# Patient Record
Sex: Male | Born: 1990 | Race: White | Hispanic: No | Marital: Single
Health system: Southern US, Community
[De-identification: ages and names within clinical notes are randomized; demographics above are authoritative.]

---

## 2014-10-02 DIAGNOSIS — R109 Unspecified abdominal pain: Secondary | ICD-10-CM | POA: Diagnosis present

## 2014-10-02 DIAGNOSIS — R51 Headache: Secondary | ICD-10-CM | POA: Insufficient documentation

## 2014-10-02 DIAGNOSIS — K529 Noninfective gastroenteritis and colitis, unspecified: Secondary | ICD-10-CM | POA: Insufficient documentation

## 2014-10-02 DIAGNOSIS — E876 Hypokalemia: Secondary | ICD-10-CM | POA: Diagnosis not present

## 2014-10-03 ENCOUNTER — Emergency Department: Payer: 59

## 2014-10-03 ENCOUNTER — Emergency Department
Admission: EM | Admit: 2014-10-03 | Discharge: 2014-10-03 | Disposition: A | Discharge status: Home / Self Care | Payer: 59 | Attending: Emergency Medicine | Admitting: Emergency Medicine

## 2014-10-03 DIAGNOSIS — R1084 Generalized abdominal pain: Secondary | ICD-10-CM

## 2014-10-03 DIAGNOSIS — R197 Diarrhea, unspecified: Secondary | ICD-10-CM

## 2014-10-03 DIAGNOSIS — R112 Nausea with vomiting, unspecified: Secondary | ICD-10-CM

## 2014-10-03 DIAGNOSIS — K529 Noninfective gastroenteritis and colitis, unspecified: Secondary | ICD-10-CM

## 2014-10-03 DIAGNOSIS — E876 Hypokalemia: Secondary | ICD-10-CM

## 2014-10-03 LAB — CBC WITH DIFFERENTIAL/PLATELET
Basophils Absolute: 0 10*3/uL (ref 0–0.1)
Basophils Relative: 0 %
EOS ABS: 0.1 10*3/uL (ref 0–0.7)
Eosinophils Relative: 1 %
HCT: 43 % (ref 40.0–52.0)
HEMOGLOBIN: 14.3 g/dL (ref 13.0–18.0)
Lymphocytes Relative: 12 %
Lymphs Abs: 1.4 10*3/uL (ref 1.0–3.6)
MCH: 29.8 pg (ref 26.0–34.0)
MCHC: 33.4 g/dL (ref 32.0–36.0)
MCV: 89.3 fL (ref 80.0–100.0)
MONO ABS: 0.9 10*3/uL (ref 0.2–1.0)
Monocytes Relative: 7 %
Neutro Abs: 9.5 10*3/uL — ABNORMAL HIGH (ref 1.4–6.5)
Neutrophils Relative %: 80 %
Platelets: 172 10*3/uL (ref 150–440)
RBC: 4.81 MIL/uL (ref 4.40–5.90)
RDW: 13.3 % (ref 11.5–14.5)
WBC: 11.9 10*3/uL — AB (ref 3.8–10.6)

## 2014-10-03 LAB — COMPREHENSIVE METABOLIC PANEL
ALBUMIN: 4.3 g/dL (ref 3.5–5.0)
ALT: 20 U/L (ref 17–63)
AST: 26 U/L (ref 15–41)
Alkaline Phosphatase: 49 U/L (ref 38–126)
Anion gap: 11 (ref 5–15)
BUN: 8 mg/dL (ref 6–20)
CO2: 23 mmol/L (ref 22–32)
Calcium: 9.5 mg/dL (ref 8.9–10.3)
Chloride: 102 mmol/L (ref 101–111)
Creatinine, Ser: 1.11 mg/dL (ref 0.61–1.24)
GFR calc non Af Amer: >60 mL/min (ref >60)
Glucose, Bld: 134 mg/dL — ABNORMAL HIGH (ref 65–99)
Potassium: 3.1 mmol/L — ABNORMAL LOW (ref 3.5–5.1)
Sodium: 136 mmol/L (ref 135–145)
Total Bilirubin: 0.8 mg/dL (ref 0.3–1.2)
Total Protein: 7.5 g/dL (ref 6.5–8.1)

## 2014-10-03 LAB — LIPASE, BLOOD: LIPASE: 46 U/L (ref 22–51)

## 2014-10-03 LAB — C DIFFICILE QUICK SCREEN W PCR REFLEX
C DIFFICILE (CDIFF) INTERP: NEGATIVE
C DIFFICILE (CDIFF) TOXIN: NEGATIVE
C Diff antigen: NEGATIVE

## 2014-10-03 MED ORDER — MORPHINE SULFATE 2 MG/ML IJ SOLN
INTRAMUSCULAR | Status: AC
  Administered 2014-10-03: 2 mg via INTRAVENOUS
  Filled 2014-10-03: qty 1

## 2014-10-03 MED ORDER — DICYCLOMINE HCL 20 MG PO TABS: 20.0000 mg | ORAL_TABLET | Freq: Four times a day (QID) | ORAL | Status: AC | PRN

## 2014-10-03 MED ORDER — MORPHINE SULFATE 2 MG/ML IJ SOLN
INTRAMUSCULAR | Status: AC
  Filled 2014-10-03: qty 1

## 2014-10-03 MED ORDER — ONDANSETRON HCL 4 MG/2ML IJ SOLN
4.0000 mg | Freq: Once | INTRAMUSCULAR | Status: AC
  Administered 2014-10-03: 4 mg via INTRAVENOUS

## 2014-10-03 MED ORDER — POTASSIUM CHLORIDE CRYS ER 20 MEQ PO TBCR
EXTENDED_RELEASE_TABLET | ORAL | Status: AC
  Administered 2014-10-03: 40 meq via ORAL
  Filled 2014-10-03: qty 2

## 2014-10-03 MED ORDER — IOHEXOL 240 MG/ML SOLN
25.0000 mL | Freq: Once | INTRAMUSCULAR | Status: AC | PRN
  Administered 2014-10-03: 25 mL via ORAL

## 2014-10-03 MED ORDER — MORPHINE SULFATE 2 MG/ML IJ SOLN
2.0000 mg | Freq: Once | INTRAMUSCULAR | Status: AC
  Administered 2014-10-03: 2 mg via INTRAVENOUS

## 2014-10-03 MED ORDER — SODIUM CHLORIDE 0.9 % IV BOLUS (SEPSIS)
1000.0000 mL | Freq: Once | INTRAVENOUS | Status: AC
  Administered 2014-10-03: 1000 mL via INTRAVENOUS

## 2014-10-03 MED ORDER — POTASSIUM CHLORIDE CRYS ER 20 MEQ PO TBCR
40.0000 meq | EXTENDED_RELEASE_TABLET | Freq: Once | ORAL | Status: AC
  Administered 2014-10-03: 40 meq via ORAL

## 2014-10-03 MED ORDER — ONDANSETRON HCL 4 MG PO TABS: 4.0000 mg | ORAL_TABLET | Freq: Three times a day (TID) | ORAL | Status: AC | PRN

## 2014-10-03 MED ORDER — ONDANSETRON HCL 4 MG/2ML IJ SOLN
INTRAMUSCULAR | Status: AC
  Administered 2014-10-03: 4 mg via INTRAVENOUS
  Filled 2014-10-03: qty 2

## 2014-10-03 MED ORDER — ONDANSETRON HCL 4 MG/2ML IJ SOLN
INTRAMUSCULAR | Status: AC
  Filled 2014-10-03: qty 2

## 2014-10-03 MED ORDER — IOHEXOL 300 MG/ML  SOLN
100.0000 mL | Freq: Once | INTRAMUSCULAR | Status: AC | PRN
  Administered 2014-10-03: 100 mL via INTRAVENOUS

## 2014-10-03 NOTE — ED Notes (Signed)
Pt. Leaving with parents.

## 2014-10-03 NOTE — ED Notes (Signed)
Pt to CT at this time.

## 2014-10-03 NOTE — ED Notes (Addendum)
Hat placed in toilet at this time, pt made aware and verbalized understanding for need for sample. Will let RN know once uses restroom.

## 2014-10-03 NOTE — ED Notes (Signed)
Pt in with co abd pain, n.v.d since yest and fever.

## 2014-10-03 NOTE — Discharge Instructions (Signed)
1. Take medicines as needed for nausea and abdominal cramping (Zofran/Bentyl #20). 2. Clear liquids 12 hours, then Brat diet 2 days. Slowly advance diet as tolerated. 3. Return to the ER for worsening symptoms, persistent vomiting, difficulty breathing or other concerns.  Nausea and Vomiting Nausea is a sick feeling that often comes before throwing up (vomiting). Vomiting is a reflex where stomach contents come out of your mouth. Vomiting can cause severe loss of body fluids (dehydration). Children and elderly adults can become dehydrated quickly, especially if they also have diarrhea. Nausea and vomiting are symptoms of a condition or disease. It is important to find the cause of your symptoms. CAUSES   Direct irritation of the stomach lining. This irritation can result from increased acid production (gastroesophageal reflux disease), infection, food poisoning, taking certain medicines (such as nonsteroidal anti-inflammatory drugs), alcohol use, or tobacco use.  Signals from the brain.These signals could be caused by a headache, heat exposure, an inner ear disturbance, increased pressure in the brain from injury, infection, a tumor, or a concussion, pain, emotional stimulus, or metabolic problems.  An obstruction in the gastrointestinal tract (bowel obstruction).  Illnesses such as diabetes, hepatitis, gallbladder problems, appendicitis, kidney problems, cancer, sepsis, atypical symptoms of a heart attack, or eating disorders.  Medical treatments such as chemotherapy and radiation.  Receiving medicine that makes you sleep (general anesthetic) during surgery. DIAGNOSIS Your caregiver may ask for tests to be done if the problems do not improve after a few days. Tests may also be done if symptoms are severe or if the reason for the nausea and vomiting is not clear. Tests may include:  Urine tests.  Blood tests.  Stool tests.  Cultures (to look for evidence of infection).  X-rays or  other imaging studies. Test results can help your caregiver make decisions about treatment or the need for additional tests. TREATMENT You need to stay well hydrated. Drink frequently but in small amounts.You may wish to drink water, sports drinks, clear broth, or eat frozen ice pops or gelatin dessert to help stay hydrated.When you eat, eating slowly may help prevent nausea.There are also some antinausea medicines that may help prevent nausea. HOME CARE INSTRUCTIONS   Take all medicine as directed by your caregiver.  If you do not have an appetite, do not force yourself to eat. However, you must continue to drink fluids.  If you have an appetite, eat a normal diet unless your caregiver tells you differently.  Eat a variety of complex carbohydrates (rice, wheat, potatoes, bread), lean meats, yogurt, fruits, and vegetables.  Avoid high-fat foods because they are more difficult to digest.  Drink enough water and fluids to keep your urine clear or pale yellow.  If you are dehydrated, ask your caregiver for specific rehydration instructions. Signs of dehydration may include:  Severe thirst.  Dry lips and mouth.  Dizziness.  Dark urine.  Decreasing urine frequency and amount.  Confusion.  Rapid breathing or pulse. SEEK IMMEDIATE MEDICAL CARE IF:   You have blood or brown flecks (like coffee grounds) in your vomit.  You have black or bloody stools.  You have a severe headache or stiff neck.  You are confused.  You have severe abdominal pain.  You have chest pain or trouble breathing.  You do not urinate at least once every 8 hours.  You develop cold or clammy skin.  You continue to vomit for longer than 24 to 48 hours.  You have a fever. MAKE SURE YOU:  Understand these instructions.  Will watch your condition.  Will get help right away if you are not doing well or get worse. Document Released: 03/21/2005 Document Revised: 06/13/2011 Document Reviewed:  08/18/2010 [facility] Patient Information 2015 [place], [place]. This information is not intended to replace advice given to you by your health care provider. Make sure you discuss any questions you have with your health care provider.  Colitis Colitis is inflammation of the colon. Colitis can be a short-term or long-standing (chronic) illness. Crohn's disease and ulcerative colitis are 2 types of colitis which are chronic. They usually require lifelong treatment. CAUSES  There are many different causes of colitis, including:  Viruses.  Germs (bacteria).  Medicine reactions. SYMPTOMS   Diarrhea.  Intestinal bleeding.  Pain.  Fever.  Throwing up (vomiting).  Tiredness (fatigue).  Weight loss.  Bowel blockage. DIAGNOSIS  The diagnosis of colitis is based on examination and stool or blood tests. X-rays, CT scan, and colonoscopy may also be needed. TREATMENT  Treatment may include:  Fluids given through the vein (intravenously).  Bowel rest (nothing to eat or drink for a period of time).  Medicine for pain and diarrhea.  Medicines (antibiotics) that kill germs.  Cortisone medicines.  Surgery. HOME CARE INSTRUCTIONS   Get plenty of rest.  Drink enough water and fluids to keep your urine clear or pale yellow.  Eat a well-balanced diet.  Call your caregiver for follow-up as recommended. SEEK IMMEDIATE MEDICAL CARE IF:   You develop chills.  You have an oral temperature above 102 F (38.9 C), not controlled by medicine.  You have extreme weakness, fainting, or dehydration.  You have repeated vomiting.  You develop severe belly (abdominal) pain or are passing bloody or tarry stools. MAKE SURE YOU:   Understand these instructions.  Will watch your condition.  Will get help right away if you are not doing well or get worse. Document Released: 04/28/2004 Document Revised: 06/13/2011 Document Reviewed: 07/24/2009 [facility] Patient Information 2015 [place],  [place]. This information is not intended to replace advice given to you by your health care provider. Make sure you discuss any questions you have with your health care provider.  Diarrhea Diarrhea is frequent loose and watery bowel movements. It can cause you to feel weak and dehydrated. Dehydration can cause you to become tired and thirsty, have a dry mouth, and have decreased urination that often is dark yellow. Diarrhea is a sign of another problem, most often an infection that will not last long. In most cases, diarrhea typically lasts 2-3 days. However, it can last longer if it is a sign of something more serious. It is important to treat your diarrhea as directed by your caregiver to lessen or prevent future episodes of diarrhea. CAUSES  Some common causes include:  Gastrointestinal infections caused by viruses, bacteria, or parasites.  Food poisoning or food allergies.  Certain medicines, such as antibiotics, chemotherapy, and laxatives.  Artificial sweeteners and fructose.  Digestive disorders. HOME CARE INSTRUCTIONS  Ensure adequate fluid intake (hydration): Have 1 cup (8 oz) of fluid for each diarrhea episode. Avoid fluids that contain simple sugars or sports drinks, fruit juices, whole milk products, and sodas. Your urine should be clear or pale yellow if you are drinking enough fluids. Hydrate with an oral rehydration solution that you can purchase at pharmacies, retail stores, and online. You can prepare an oral rehydration solution at home by mixing the following ingredients together:   - tsp table salt.   tsp baking  soda.   tsp salt substitute containing potassium chloride.  1  tablespoons sugar.  1 L (34 oz) of water.  Certain foods and beverages may increase the speed at which food moves through the gastrointestinal (GI) tract. These foods and beverages should be avoided and include:  Caffeinated and alcoholic beverages.  High-fiber foods, such as raw fruits and  vegetables, nuts, seeds, and whole grain breads and cereals.  Foods and beverages sweetened with sugar alcohols, such as xylitol, sorbitol, and mannitol.  Some foods may be well tolerated and may help thicken stool including:  Starchy foods, such as rice, toast, pasta, low-sugar cereal, oatmeal, grits, baked potatoes, crackers, and bagels.  Bananas.  Applesauce.  Add probiotic-rich foods to help increase healthy bacteria in the GI tract, such as yogurt and fermented milk products.  Wash your hands well after each diarrhea episode.  Only take over-the-counter or prescription medicines as directed by your caregiver.  Take a warm bath to relieve any burning or pain from frequent diarrhea episodes. SEEK IMMEDIATE MEDICAL CARE IF:   You are unable to keep fluids down.  You have persistent vomiting.  You have blood in your stool, or your stools are black and tarry.  You do not urinate in 6-8 hours, or there is only a small amount of very dark urine.  You have abdominal pain that increases or localizes.  You have weakness, dizziness, confusion, or light-headedness.  You have a severe headache.  Your diarrhea gets worse or does not get better.  You have a fever or persistent symptoms for more than 2-3 days.  You have a fever and your symptoms suddenly get worse. MAKE SURE YOU:   Understand these instructions.  Will watch your condition.  Will get help right away if you are not doing well or get worse. Document Released: 03/11/2002 Document Revised: 08/05/2013 Document Reviewed: 11/27/2011 [facility] Patient Information 2015 [place], [place]. This information is not intended to replace advice given to you by your health care provider. Make sure you discuss any questions you have with your health care provider.  Abdominal Pain Many things can cause abdominal pain. Usually, abdominal pain is not caused by a disease and will improve without treatment. It can often be observed and  treated at home. Your health care provider will do a physical exam and possibly order blood tests and X-rays to help determine the seriousness of your pain. However, in many cases, more time must pass before a clear cause of the pain can be found. Before that point, your health care provider may not know if you need more testing or further treatment. HOME CARE INSTRUCTIONS  Monitor your abdominal pain for any changes. The following actions may help to alleviate any discomfort you are experiencing:  Only take over-the-counter or prescription medicines as directed by your health care provider.  Do not take laxatives unless directed to do so by your health care provider.  Try a clear liquid diet (broth, tea, or water) as directed by your health care provider. Slowly move to a bland diet as tolerated. SEEK MEDICAL CARE IF:  You have unexplained abdominal pain.  You have abdominal pain associated with nausea or diarrhea.  You have pain when you urinate or have a bowel movement.  You experience abdominal pain that wakes you in the night.  You have abdominal pain that is worsened or improved by eating food.  You have abdominal pain that is worsened with eating fatty foods.  You have a fever. SEEK  IMMEDIATE MEDICAL CARE IF:   Your pain does not go away within 2 hours.  You keep throwing up (vomiting).  Your pain is felt only in portions of the abdomen, such as the right side or the left lower portion of the abdomen.  You pass bloody or black tarry stools. MAKE SURE YOU:  Understand these instructions.   Will watch your condition.   Will get help right away if you are not doing well or get worse.  Document Released: 12/29/2004 Document Revised: 03/26/2013 Document Reviewed: 11/28/2012 [facility] Patient Information 2015 [place], [place]. This information is not intended to replace advice given to you by your health care provider. Make sure you discuss any questions you have with your  health care provider.  Hypokalemia Hypokalemia means that the amount of potassium in the blood is lower than normal.Potassium is a chemical, called an electrolyte, that helps regulate the amount of fluid in the body. It also stimulates muscle contraction and helps nerves function properly.Most of the body's potassium is inside of cells, and only a very small amount is in the blood. Because the amount in the blood is so small, minor changes can be life-threatening. CAUSES  Antibiotics.  Diarrhea or vomiting.  Using laxatives too much, which can cause diarrhea.  Chronic kidney disease.  Water pills (diuretics).  Eating disorders (bulimia).  Low magnesium level.  Sweating a lot. SIGNS AND SYMPTOMS  Weakness.  Constipation.  Fatigue.  Muscle cramps.  Mental confusion.  Skipped heartbeats or irregular heartbeat (palpitations).  Tingling or numbness. DIAGNOSIS  Your health care provider can diagnose hypokalemia with blood tests. In addition to checking your potassium level, your health care provider may also check other lab tests. TREATMENT Hypokalemia can be treated with potassium supplements taken by mouth or adjustments in your current medicines. If your potassium level is very low, you may need to get potassium through a vein (IV) and be monitored in the hospital. A diet high in potassium is also helpful. Foods high in potassium are:  Nuts, such as peanuts and pistachios.  Seeds, such as sunflower seeds and pumpkin seeds.  Peas, lentils, and lima beans.  Whole grain and bran cereals and breads.  Fresh fruit and vegetables, such as apricots, avocado, bananas, cantaloupe, kiwi, oranges, tomatoes, asparagus, and potatoes.  Orange and tomato juices.  Red meats.  Fruit yogurt. HOME CARE INSTRUCTIONS  Take all medicines as prescribed by your health care provider.  Maintain a healthy diet by including nutritious food, such as fruits, vegetables, nuts, whole  grains, and lean meats.  If you are taking a laxative, be sure to follow the directions on the label. SEEK MEDICAL CARE IF:  Your weakness gets worse.  You feel your heart pounding or racing.  You are vomiting or having diarrhea.  You are diabetic and having trouble keeping your blood glucose in the normal range. SEEK IMMEDIATE MEDICAL CARE IF:  You have chest pain, shortness of breath, or dizziness.  You are vomiting or having diarrhea for more than 2 days.  You faint. MAKE SURE YOU:   Understand these instructions.  Will watch your condition.  Will get help right away if you are not doing well or get worse. Document Released: 03/21/2005 Document Revised: 01/09/2013 Document Reviewed: 09/21/2012 [facility] Patient Information 2015 [place], [place]. This information is not intended to replace advice given to you by your health care provider. Make sure you discuss any questions you have with your health care provider.

## 2014-10-03 NOTE — ED Provider Notes (Signed)
[facility] Emergency Department Provider Note  ____________________________________________  Time seen: Approximately 12:32 AM  I have reviewed the triage vital signs and the nursing notes.   HISTORY  Chief Complaint Abdominal Pain  History obtained by patient and his parents  HPI Arthur Nelson is a 24 y.o. male who presents with a 25 hour history of abdominal pain, nausea, vomiting and diarrhea associated with fever to 101.49F. Patient has a history of C. difficile; has been on cefdinir for sinus infection 7 days. [place] began having symptoms of sinus infection and started on antibiotics. Patient began with sudden onset of fever, chills, diarrhea worse than vomiting, abdominal pain approximately 11 PM evening before last. States has had approximately 10 episodes of diarrhea, 4 episodes of vomiting. When he awoke this morning he felt lightheaded and fell, striking head without losing consciousness. His mother, who is a Engineer, civil (consulting), attempted oral hydration all day. Patient presents to the ED for persistent abdominal pain. Took Tylenol and Motrin prior to arrival. Last bout of emesis approximately 6 PM; patient has been able to tolerate some crackers since. Last episode of diarrhea prior to this exam.   Past medical history Sinus infections C. Difficile  There are no active problems to display for this patient.  Past surgical history None  No current outpatient prescriptions on file.  Allergies Review of patient's allergies indicates no known allergies.  No family history on file.  Social History History  Substance Use Topics  . Smoking status: Not on file  . Smokeless tobacco: Not on file  . Alcohol Use: Not on file  Nonsmoker  Review of Systems Constitutional: Positive for fever/chills Eyes: No visual changes. ENT: No sore throat. Cardiovascular: Denies chest pain. Respiratory: Denies shortness of  breath. Gastrointestinal: Positive for abdominal pain, nausea, vomiting and diarrhea. Genitourinary: Negative for dysuria. Musculoskeletal: Negative for back pain. Skin: Negative for rash. Neurological: Positive for for headache. Negative for focal weakness or numbness.  10-point ROS otherwise negative.  ____________________________________________   PHYSICAL EXAM:  VITAL SIGNS: ED Triage Vitals  Enc Vitals Group     BP 10/03/14 0002 130/77 mmHg     Pulse Rate 10/03/14 0002 82     Resp 10/03/14 0002 18     Temp 10/03/14 0002 98.9 F (37.2 C)     Temp Source 10/03/14 0002 Oral     SpO2 10/03/14 0002 99 %     Weight 10/03/14 0002 175 lb (79.379 kg)     Height 10/03/14 0002  (1.854 m)     Head Cir --      Peak Flow --      Pain Score 10/03/14 0002 3     Pain Loc --      Pain Edu? --      Excl. in GC? --     Constitutional: Alert and oriented. Well appearing and in mild acute distress. Eyes: Conjunctivae are normal. PERRL. EOMI. Head: Atraumatic. Nose: No congestion/rhinnorhea. Mouth/Throat: Mucous membranes are mildly dry.  Oropharynx non-erythematous. Neck: No stridor.   Cardiovascular: Normal rate, regular rhythm. Grossly normal heart sounds.  Good peripheral circulation. Respiratory: Normal respiratory effort.  No retractions. Lungs CTAB. Gastrointestinal: Soft, diffuse mild tenderness to palpation without rebound or guarding. No distention. No abdominal bruits. No CVA tenderness. Musculoskeletal: No lower extremity tenderness nor edema.  No joint effusions. Neurologic:  Normal speech and language. No gross focal neurologic deficits are appreciated. Speech is normal.  Skin:  Skin is warm, dry and intact. No rash noted. Psychiatric: Mood and affect are normal. Speech and behavior are normal.  ____________________________________________   LABS (all labs ordered are listed, but only abnormal results are displayed)  Labs Reviewed  CBC WITH DIFFERENTIAL/PLATELET  - Abnormal; Notable for the following:    WBC 11.9 (*)    Neutro Abs 9.5 (*)    All other components within normal limits  COMPREHENSIVE METABOLIC PANEL - Abnormal; Notable for the following:    Potassium 3.1 (*)    Glucose, Bld 134 (*)    All other components within normal limits  C DIFFICILE QUICK SCAN W PCR REFLEX ([facility] ONLY)  STOOL CULTURE  LIPASE, BLOOD   ____________________________________________  EKG  None ____________________________________________  RADIOLOGY  CT abdomen and pelvis with contrast interpreted per Dr. Clovis RileyMitchell: Mild colonic mural edema. This likely represents a very mild infectious or inflammatory colitis.  ____________________________________________   PROCEDURES  Procedure(s) performed: None  Critical Care performed: No  ____________________________________________   INITIAL IMPRESSION / ASSESSMENT AND PLAN / ED COURSE  Pertinent labs & imaging results that were available during my care of the patient were reviewed by me and considered in my medical decision making (see chart for details).  24 year old male who presents with a one-day history of fever, abdominal pain, vomiting and diarrhea with an abdominal exam which is diffusely tender to palpation. History of C. difficile. Plan for IV fluid resuscitation, IV antiemetic and analgesia, obtain stool sample if patient can produce it; CT abdomen/pelvis to evaluate infectious/inflammatory etiology.  ----------------------------------------- 3:23 AM on 10/03/2014 -----------------------------------------  Updated patient and parents of laboratory and CT results. Patient initially improved after morphine; complains of abdominal cramping returning. Will try some crackers and ginger ale to see if he can produce a stool specimen.  ----------------------------------------- 5:10 AM on 10/03/2014 -----------------------------------------  Updated family of negative C. difficile results. Patient  sleeping in no acute distress. Tolerated by mouth without emesis. Plan for patient to stop Cefdinir, Bentyl, Zofran and follow-up with his PCP. Strict return precautions given. All verbalize understanding and agree with plan of care.  ____________________________________________   FINAL CLINICAL IMPRESSION(S) / ED DIAGNOSES  Final diagnoses:  Nausea vomiting and diarrhea  Generalized abdominal pain  Colitis      Irean HongJade J Mykenzi Vanzile, MD 10/03/14 [phone]

## 2014-10-03 NOTE — ED Notes (Signed)
MD Sung at bedside. 

## 2014-10-06 LAB — STOOL CULTURE: SPECIAL REQUESTS: NORMAL

## 2016-02-19 IMAGING — CT CT ABD-PELV W/ CM
1 of 2 series · 15 of 32 positions shown, 19 images · IV contrast (omnipaque)
Comparison: None.

CLINICAL DATA: Nausea vomiting and pain since yesterday.  Fever.

EXAM:
CT ABDOMEN AND PELVIS WITH CONTRAST
TECHNIQUE: Multidetector CT imaging of the abdomen and pelvis was performed
using the standard protocol following bolus administration of
intravenous contrast.
CONTRAST:  100mL OMNIPAQUE IOHEXOL 300 MG/ML  SOLN

[Series 2: routine abd pel with · axial · 0.64mm/px · z∈[-507,-57]mm · 15 of 98 slices shown, 19 images]
[im 4/98  soft-tissue]
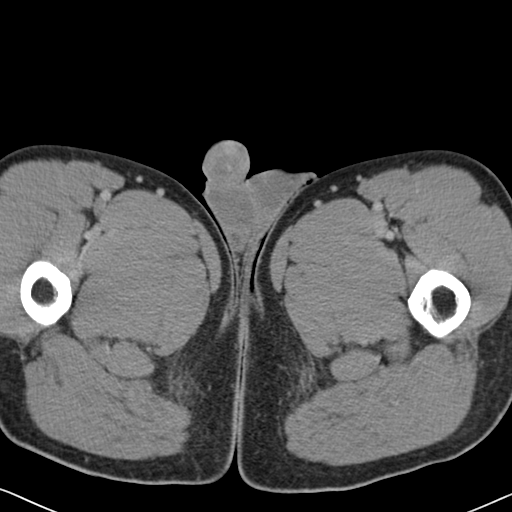
[im 4/98  bone]
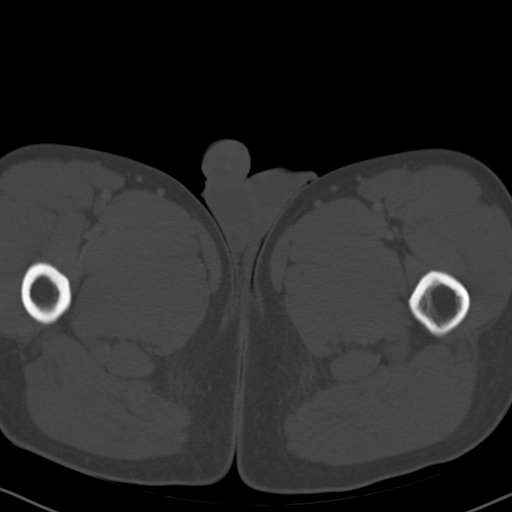
[im 12/98  soft-tissue]
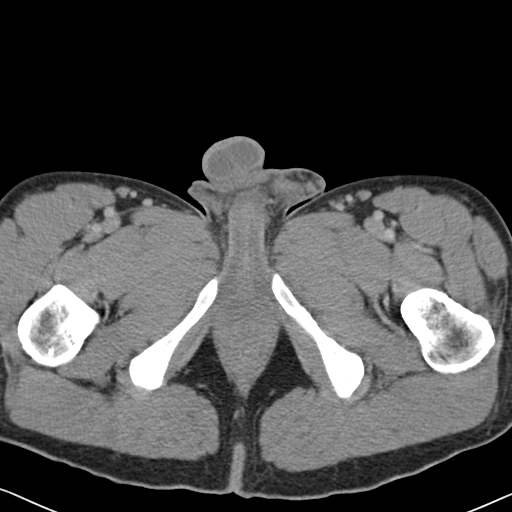
[im 20/98  soft-tissue]
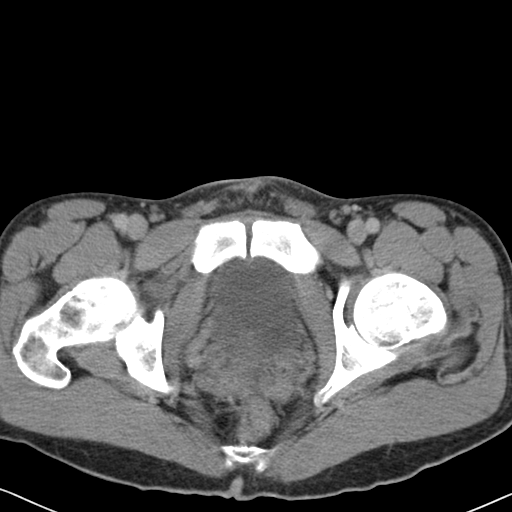
[im 28/98  soft-tissue]
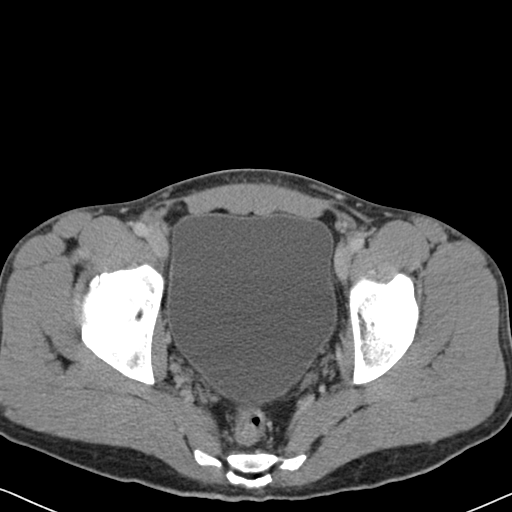
[im 35/98  soft-tissue]
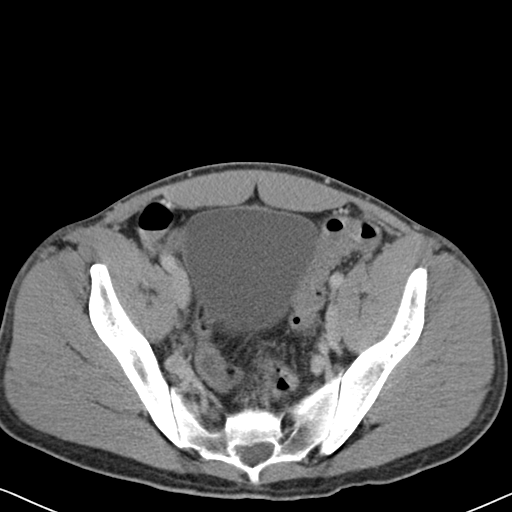
[im 43/98  soft-tissue]
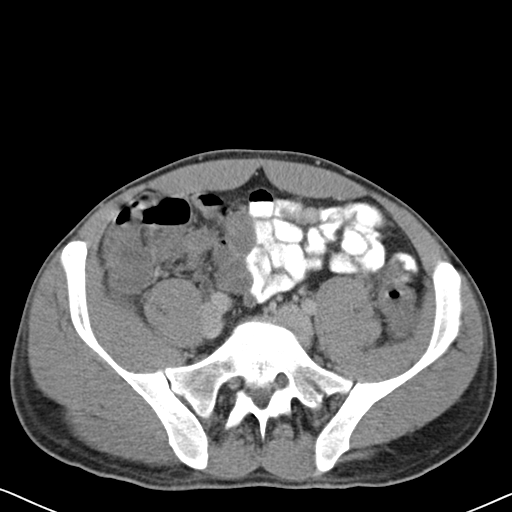
[im 51/98  soft-tissue]
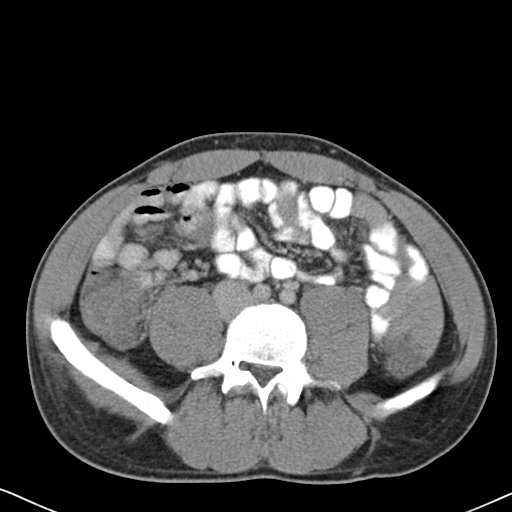
[im 55/98  soft-tissue]
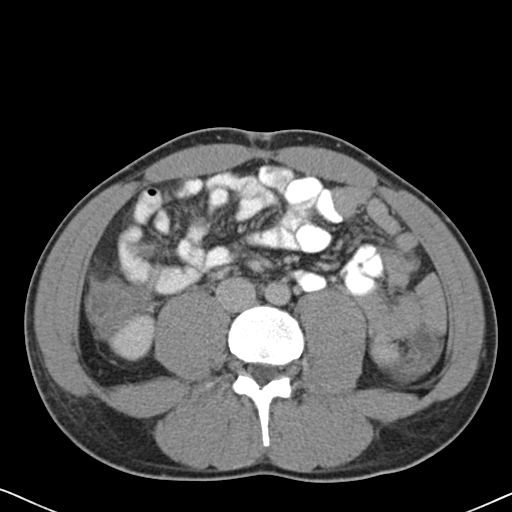
[im 63/98  soft-tissue]
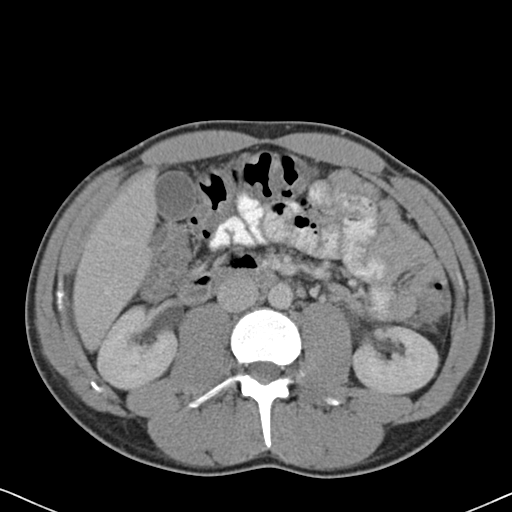
[im 63/98  bone]
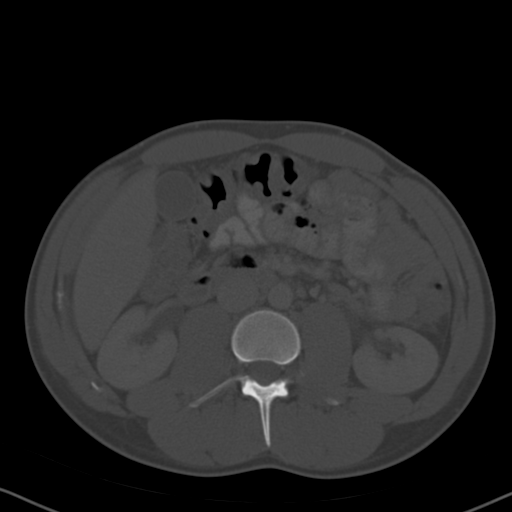
[im 70/98  soft-tissue]
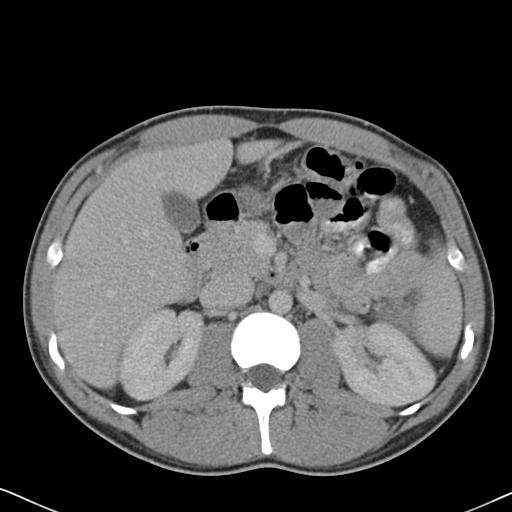
[im 78/98  soft-tissue]
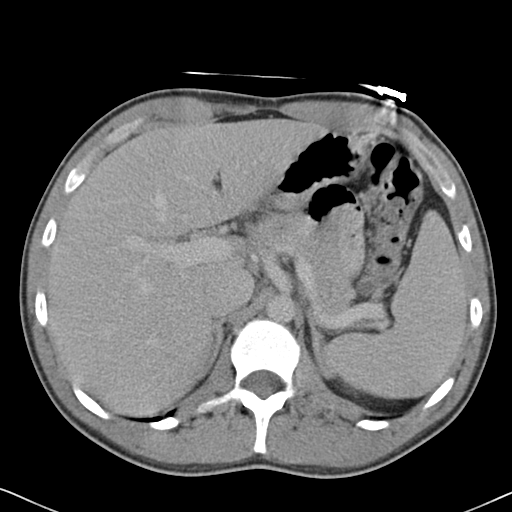
[im 82/98  lung]
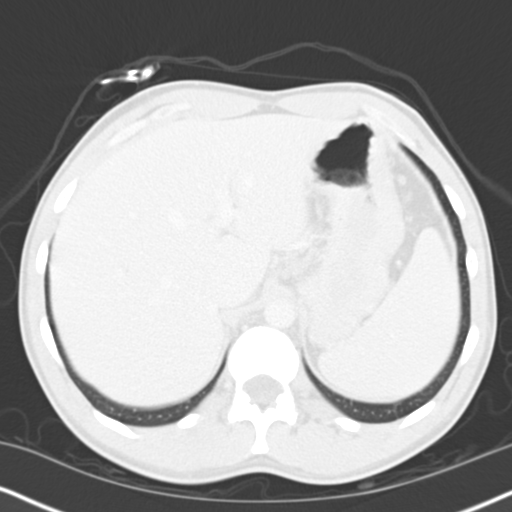
[im 86/98  soft-tissue]
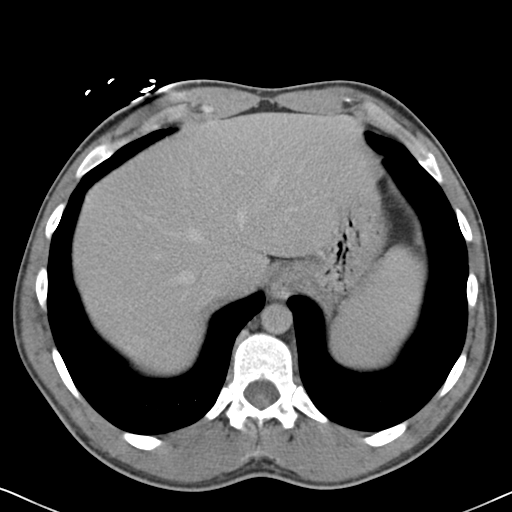
[im 86/98  lung]
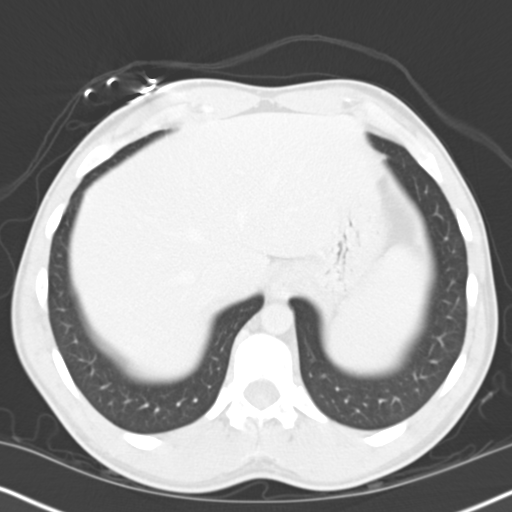
[im 90/98  lung]
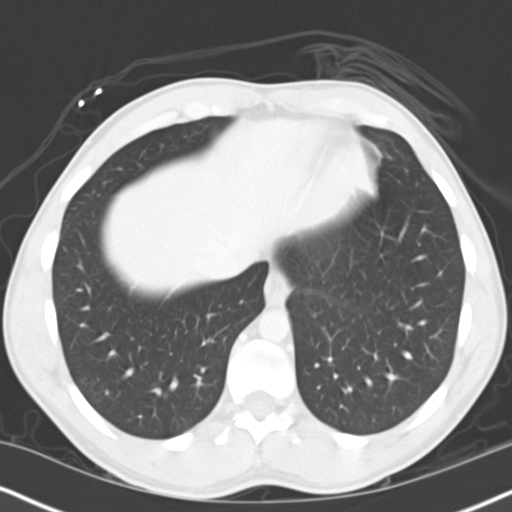
[im 94/98  soft-tissue]
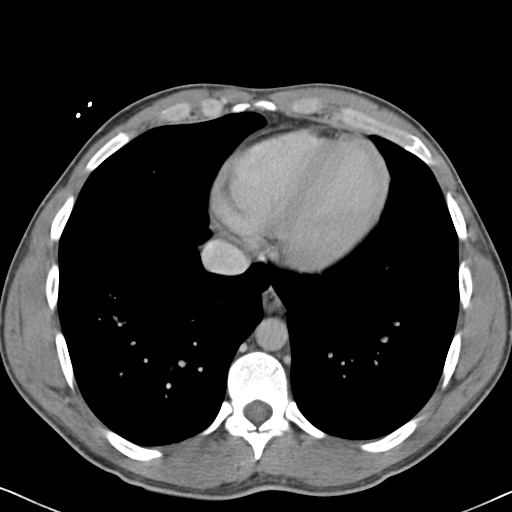
[im 94/98  lung]
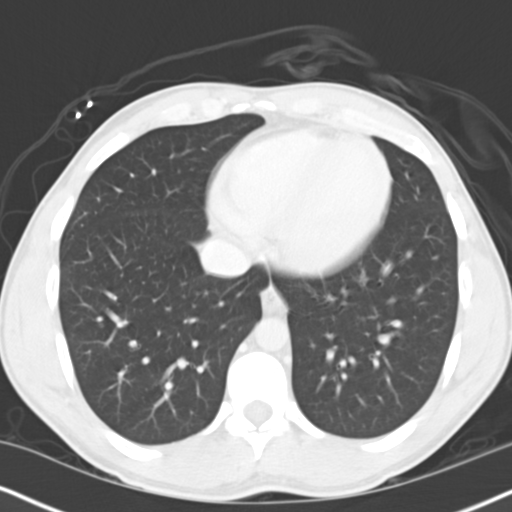

[15 of 32 positions shown; findings below may reference images not displayed]

FINDINGS: There are normal appearances of the liver, spleen, pancreas,
adrenals and kidneys. Gallbladder and bile ducts appear
unremarkable. There is mild mural edema of the colon. There is no
bowel obstruction. Oral contrast has progressed through to the
distal ileum. Small bowel and stomach appear unremarkable. There is
no focal inflammatory change in the abdomen or pelvis. There is no
extraluminal air. There is no ascites. There is no adenopathy. The
abdominal aorta is normal in caliber.

There is no abnormality in the lower chest. There is no significant
musculoskeletal abnormality.
IMPRESSION: Mild colonic mural edema. This likely represents a very mild
infectious or inflammatory colitis.
